# Patient Record
Sex: Male | Born: 1990
Health system: Southern US, Community
[De-identification: ages and names within clinical notes are randomized; demographics above are authoritative.]

## PROBLEM LIST (undated history)

## (undated) DIAGNOSIS — F419 Anxiety disorder, unspecified: Secondary | ICD-10-CM

## (undated) DIAGNOSIS — F32A Depression, unspecified: Secondary | ICD-10-CM

## (undated) HISTORY — DX: Depression, unspecified: F32.A

## (undated) HISTORY — DX: Anxiety disorder, unspecified: F41.9

---
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
## 2013-10-27 ENCOUNTER — Telehealth: Payer: Self-pay | Admitting: Family Medicine

## 2013-10-27 NOTE — Telephone Encounter (Signed)
Patient aware that we have no available appointments today and that he can try urgent care or i will be glad to schedule him on monday

## 2014-04-30 ENCOUNTER — Emergency Department: Payer: Self-pay | Admitting: Emergency Medicine

## 2015-10-22 ENCOUNTER — Ambulatory Visit (INDEPENDENT_AMBULATORY_CARE_PROVIDER_SITE_OTHER): Payer: Federal, State, Local not specified - PPO | Admitting: Sports Medicine

## 2015-10-22 ENCOUNTER — Encounter: Payer: Self-pay | Admitting: Sports Medicine

## 2015-10-22 DIAGNOSIS — M79672 Pain in left foot: Secondary | ICD-10-CM | POA: Diagnosis not present

## 2015-10-22 DIAGNOSIS — R61 Generalized hyperhidrosis: Secondary | ICD-10-CM

## 2015-10-22 DIAGNOSIS — L74519 Primary focal hyperhidrosis, unspecified: Secondary | ICD-10-CM | POA: Diagnosis not present

## 2015-10-22 DIAGNOSIS — Q828 Other specified congenital malformations of skin: Secondary | ICD-10-CM | POA: Diagnosis not present

## 2015-10-22 DIAGNOSIS — B070 Plantar wart: Secondary | ICD-10-CM | POA: Diagnosis not present

## 2015-10-22 NOTE — Patient Instructions (Signed)
Plantar Warts Warts are small growths on the skin. They can occur on various areas of the body. When they occur on the underside (sole) of the foot, they are called plantar warts. Plantar warts often occur in groups, with several small warts around a larger growth. They tend to develop over areas of pressure, such as the heel or the ball of the foot. Most warts are not painful, and they usually do not cause problems. However, plantar warts may cause pain when you walk because pressure is applied to them. Warts often go away on their own in time. Various treatments may be done if needed. Sometimes, warts go away and then they come back again. CAUSES Plantar warts are caused by a type of virus that is called human papillomavirus (HPV). HPV attacks a break in the skin of the foot. Walking barefoot can lead to exposure to the virus. These warts may spread to other areas of the sole. They spread to other areas of the body only through direct contact. RISK FACTORS Plantar warts are more likely to develop in:  People who are 10-20 years of age.  People who use public showers or locker rooms.  People who have a weakened body defense system (immune system). SYMPTOMS Plantar warts may be flat or slightly raised. They may grow into the deeper layers of skin or rise above the surface of the skin. Most plantar warts have a rough surface. They may cause pain when you use your foot to support your body weight. DIAGNOSIS A plantar wart can usually be diagnosed from its appearance. In some cases, a tissue sample may be removed (biopsy) to be looked at under a microscope. TREATMENT In many cases, warts do not need treatment. Without treatment, they often go away over a period of many months to a couple years. If treatment is needed, options may include:  Applying medicated solutions, creams, or patches to the wart. These may be over-the-counter or prescription medicines that make the skin soft so that layers will  gradually shed away. In many cases, the medicine is applied one or two times per day and covered with a bandage.  Putting duct tape over the top of the wart (occlusion). You will leave the tape in place for as long as told by your health care provider, then you will replace it with a new strip of tape. This is done until the wart goes away.  Freezing the wart with liquid nitrogen (cryotherapy).  Burning the wart with:  Laser treatment.  An electrified probe (electrocautery).  Injection of a medicine (Candida antigen) into the wart to help the body's immune system to fight off the wart.  Surgery to remove the wart. HOME CARE INSTRUCTIONS  Apply medicated creams or solutions only as told by your health care provider. This may involve:  Soaking the affected area in warm water.  Removing the top layer of softened skin before you apply the medicine. A pumice stone works well for removing the tissue.  Applying a bandage over the affected area after you apply the medicine.  Repeating the process daily or as told by your health care provider.  Do not scratch or pick at a wart.  Wash your hands after you touch a wart.  If a wart is painful, try applying a bandage with a hole in the middle over the wart. The helps to take pressure off the wart.  Keep all follow-up visits as told by your health care provider. This is important. PREVENTION   Take these actions to help prevent warts:  Wear shoes and socks. Change your socks daily.  Keep your feet clean and dry.  Check your feet regularly.  Avoid direct contact with warts on other people. SEEK MEDICAL CARE IF:  Your warts do not improve after treatment.  You have redness, swelling, or pain at the site of a wart.  You have bleeding from a wart that does not stop with light pressure.  You have diabetes and you develop a wart.   This information is not intended to replace advice given to you by your health care provider. Make sure  you discuss any questions you have with your health care provider.   Document Released: 05/16/2003 Document Revised: 11/14/2014 Document Reviewed: 05/21/2014 Elsevier Interactive Patient Education 2016 [employer]  

## 2015-10-22 NOTE — Progress Notes (Signed)
  Subjective: James Oliver is a 25 y.o. male patient who presents to office for evaluation of Left foot pain secondary to painful wart at the bottom of left foot. Patient has tried cleansing and trimming with no relief in symptoms. Patient denies any other pedal complaints.   There are no active problems to display for this patient.   No current outpatient prescriptions on file prior to visit.   No current facility-administered medications on file prior to visit.     No Known Allergies  Objective:  General: Alert and oriented x3 in no acute distress  Dermatology: Keratotic lesion present measuring <0.3cm at plantar sulcus and sub met 2 left with multiple small keratotic lesions also submet 1 and 5 on left with no skin lines transversing the lesion, pain is present with medial lateral pressure to the lesion, capillaries with pin point bleeding noted, no webspace macerations, no ecchymosis bilateral, all nails x 10 are well manicured.  Vascular: Dorsalis Pedis and Posterior Tibial pedal pulses 2/4, Capillary Fill Time 3 seconds, + pedal hair growth bilateral, no edema bilateral lower extremities, Temperature gradient increased with increased mositure suggestive of hyperhydrosis.  Neurology: Gross sensation intact via light touch bilateral.  Musculoskeletal: Mild tenderness with palpation at the lesion site on Left, Muscular strength 5/5 in all groups without pain or limitation on range of motion. No lower extremity muscular or boney deformity noted.  Assessment and Plan: Problem List Items Addressed This Visit    None    Visit Diagnoses    Plantar wart    -  Primary   Porokeratosis       Foot pain, left       Hyperhydrosis disorder          -Complete examination performed -Discussed treatment options for wart and keratotic lesions -Parred keratoic and warty lesions using a chisel blade; treated the area with Catharidin covered with bandaid; Advised patient of  blistering reaction that will occur from application of medication and once this happens replace bandaid with neosporin and tape/bandaid -Recommend good hygiene habits and changing socks -Will Rx Drysol once warts are resolved for hyperhydrosis  -Patient to return to office in 1 month or sooner if condition worsens.  Landis Martins, DPM

## 2015-11-19 ENCOUNTER — Ambulatory Visit: Payer: Federal, State, Local not specified - PPO | Admitting: Podiatry

## 2017-09-21 DIAGNOSIS — M4316 Spondylolisthesis, lumbar region: Secondary | ICD-10-CM | POA: Diagnosis not present

## 2017-09-21 DIAGNOSIS — M48061 Spinal stenosis, lumbar region without neurogenic claudication: Secondary | ICD-10-CM | POA: Diagnosis not present

## 2017-09-21 DIAGNOSIS — I4510 Unspecified right bundle-branch block: Secondary | ICD-10-CM | POA: Diagnosis not present

## 2017-09-21 DIAGNOSIS — Z01810 Encounter for preprocedural cardiovascular examination: Secondary | ICD-10-CM | POA: Diagnosis not present

## 2017-09-23 DIAGNOSIS — Z4689 Encounter for fitting and adjustment of other specified devices: Secondary | ICD-10-CM | POA: Diagnosis not present

## 2017-09-23 DIAGNOSIS — M4726 Other spondylosis with radiculopathy, lumbar region: Secondary | ICD-10-CM | POA: Diagnosis not present

## 2017-09-23 DIAGNOSIS — M48062 Spinal stenosis, lumbar region with neurogenic claudication: Secondary | ICD-10-CM | POA: Diagnosis not present

## 2017-09-23 DIAGNOSIS — M4316 Spondylolisthesis, lumbar region: Secondary | ICD-10-CM | POA: Diagnosis not present

## 2017-09-23 DIAGNOSIS — M545 Low back pain: Secondary | ICD-10-CM | POA: Diagnosis not present

## 2017-09-23 DIAGNOSIS — M5410 Radiculopathy, site unspecified: Secondary | ICD-10-CM | POA: Diagnosis not present

## 2017-09-27 DIAGNOSIS — J439 Emphysema, unspecified: Secondary | ICD-10-CM | POA: Diagnosis not present

## 2017-09-27 DIAGNOSIS — M48062 Spinal stenosis, lumbar region with neurogenic claudication: Secondary | ICD-10-CM | POA: Diagnosis not present

## 2017-09-27 DIAGNOSIS — G4733 Obstructive sleep apnea (adult) (pediatric): Secondary | ICD-10-CM | POA: Diagnosis not present

## 2017-09-27 DIAGNOSIS — Z87891 Personal history of nicotine dependence: Secondary | ICD-10-CM | POA: Diagnosis not present

## 2017-09-27 DIAGNOSIS — M5432 Sciatica, left side: Secondary | ICD-10-CM | POA: Diagnosis not present

## 2017-09-27 DIAGNOSIS — M4726 Other spondylosis with radiculopathy, lumbar region: Secondary | ICD-10-CM | POA: Diagnosis not present

## 2017-09-27 DIAGNOSIS — K219 Gastro-esophageal reflux disease without esophagitis: Secondary | ICD-10-CM | POA: Diagnosis not present

## 2017-09-27 DIAGNOSIS — M4316 Spondylolisthesis, lumbar region: Secondary | ICD-10-CM | POA: Diagnosis not present

## 2017-09-27 DIAGNOSIS — Z79899 Other long term (current) drug therapy: Secondary | ICD-10-CM | POA: Diagnosis not present

## 2017-09-27 DIAGNOSIS — M5431 Sciatica, right side: Secondary | ICD-10-CM | POA: Diagnosis not present

## 2017-09-28 DIAGNOSIS — M4726 Other spondylosis with radiculopathy, lumbar region: Secondary | ICD-10-CM | POA: Diagnosis not present

## 2017-09-28 DIAGNOSIS — M48062 Spinal stenosis, lumbar region with neurogenic claudication: Secondary | ICD-10-CM | POA: Diagnosis not present

## 2017-10-26 DIAGNOSIS — M545 Low back pain: Secondary | ICD-10-CM | POA: Diagnosis not present

## 2017-11-05 DIAGNOSIS — E349 Endocrine disorder, unspecified: Secondary | ICD-10-CM | POA: Diagnosis not present

## 2017-11-18 DIAGNOSIS — E349 Endocrine disorder, unspecified: Secondary | ICD-10-CM | POA: Diagnosis not present

## 2017-12-03 DIAGNOSIS — E349 Endocrine disorder, unspecified: Secondary | ICD-10-CM | POA: Diagnosis not present

## 2017-12-17 DIAGNOSIS — E349 Endocrine disorder, unspecified: Secondary | ICD-10-CM | POA: Diagnosis not present

## 2017-12-17 DIAGNOSIS — E291 Testicular hypofunction: Secondary | ICD-10-CM | POA: Diagnosis not present

## 2017-12-18 DIAGNOSIS — E291 Testicular hypofunction: Secondary | ICD-10-CM | POA: Diagnosis not present

## 2017-12-28 DIAGNOSIS — M4326 Fusion of spine, lumbar region: Secondary | ICD-10-CM | POA: Diagnosis not present

## 2017-12-31 DIAGNOSIS — E349 Endocrine disorder, unspecified: Secondary | ICD-10-CM | POA: Diagnosis not present

## 2018-01-03 DIAGNOSIS — R262 Difficulty in walking, not elsewhere classified: Secondary | ICD-10-CM | POA: Diagnosis not present

## 2018-01-03 DIAGNOSIS — M545 Low back pain: Secondary | ICD-10-CM | POA: Diagnosis not present

## 2018-01-03 DIAGNOSIS — G8929 Other chronic pain: Secondary | ICD-10-CM | POA: Diagnosis not present

## 2018-01-03 DIAGNOSIS — M6281 Muscle weakness (generalized): Secondary | ICD-10-CM | POA: Diagnosis not present

## 2018-01-06 DIAGNOSIS — R262 Difficulty in walking, not elsewhere classified: Secondary | ICD-10-CM | POA: Diagnosis not present

## 2018-01-06 DIAGNOSIS — G8929 Other chronic pain: Secondary | ICD-10-CM | POA: Diagnosis not present

## 2018-01-06 DIAGNOSIS — M6281 Muscle weakness (generalized): Secondary | ICD-10-CM | POA: Diagnosis not present

## 2018-01-06 DIAGNOSIS — M545 Low back pain: Secondary | ICD-10-CM | POA: Diagnosis not present

## 2018-01-10 DIAGNOSIS — M6281 Muscle weakness (generalized): Secondary | ICD-10-CM | POA: Diagnosis not present

## 2018-01-10 DIAGNOSIS — R262 Difficulty in walking, not elsewhere classified: Secondary | ICD-10-CM | POA: Diagnosis not present

## 2018-01-10 DIAGNOSIS — M5442 Lumbago with sciatica, left side: Secondary | ICD-10-CM | POA: Diagnosis not present

## 2018-01-10 DIAGNOSIS — M545 Low back pain: Secondary | ICD-10-CM | POA: Diagnosis not present

## 2018-01-10 DIAGNOSIS — G8929 Other chronic pain: Secondary | ICD-10-CM | POA: Diagnosis not present

## 2018-01-13 DIAGNOSIS — M6281 Muscle weakness (generalized): Secondary | ICD-10-CM | POA: Diagnosis not present

## 2018-01-13 DIAGNOSIS — G8929 Other chronic pain: Secondary | ICD-10-CM | POA: Diagnosis not present

## 2018-01-13 DIAGNOSIS — M5442 Lumbago with sciatica, left side: Secondary | ICD-10-CM | POA: Diagnosis not present

## 2018-01-13 DIAGNOSIS — M545 Low back pain: Secondary | ICD-10-CM | POA: Diagnosis not present

## 2018-01-13 DIAGNOSIS — R262 Difficulty in walking, not elsewhere classified: Secondary | ICD-10-CM | POA: Diagnosis not present

## 2018-01-14 DIAGNOSIS — K219 Gastro-esophageal reflux disease without esophagitis: Secondary | ICD-10-CM | POA: Diagnosis not present

## 2018-01-14 DIAGNOSIS — E782 Mixed hyperlipidemia: Secondary | ICD-10-CM | POA: Diagnosis not present

## 2018-01-14 DIAGNOSIS — E291 Testicular hypofunction: Secondary | ICD-10-CM | POA: Diagnosis not present

## 2018-01-14 DIAGNOSIS — R7301 Impaired fasting glucose: Secondary | ICD-10-CM | POA: Diagnosis not present

## 2018-01-14 DIAGNOSIS — E349 Endocrine disorder, unspecified: Secondary | ICD-10-CM | POA: Diagnosis not present

## 2018-01-14 DIAGNOSIS — M48061 Spinal stenosis, lumbar region without neurogenic claudication: Secondary | ICD-10-CM | POA: Diagnosis not present

## 2018-01-17 DIAGNOSIS — M6281 Muscle weakness (generalized): Secondary | ICD-10-CM | POA: Diagnosis not present

## 2018-01-17 DIAGNOSIS — M545 Low back pain: Secondary | ICD-10-CM | POA: Diagnosis not present

## 2018-01-17 DIAGNOSIS — G8929 Other chronic pain: Secondary | ICD-10-CM | POA: Diagnosis not present

## 2018-01-17 DIAGNOSIS — M5442 Lumbago with sciatica, left side: Secondary | ICD-10-CM | POA: Diagnosis not present

## 2018-01-17 DIAGNOSIS — R262 Difficulty in walking, not elsewhere classified: Secondary | ICD-10-CM | POA: Diagnosis not present

## 2018-01-20 DIAGNOSIS — M6281 Muscle weakness (generalized): Secondary | ICD-10-CM | POA: Diagnosis not present

## 2018-01-20 DIAGNOSIS — G8929 Other chronic pain: Secondary | ICD-10-CM | POA: Diagnosis not present

## 2018-01-20 DIAGNOSIS — M5442 Lumbago with sciatica, left side: Secondary | ICD-10-CM | POA: Diagnosis not present

## 2018-01-20 DIAGNOSIS — R262 Difficulty in walking, not elsewhere classified: Secondary | ICD-10-CM | POA: Diagnosis not present

## 2018-01-20 DIAGNOSIS — M545 Low back pain: Secondary | ICD-10-CM | POA: Diagnosis not present

## 2018-01-24 DIAGNOSIS — M5442 Lumbago with sciatica, left side: Secondary | ICD-10-CM | POA: Diagnosis not present

## 2018-01-24 DIAGNOSIS — M545 Low back pain: Secondary | ICD-10-CM | POA: Diagnosis not present

## 2018-01-24 DIAGNOSIS — M6281 Muscle weakness (generalized): Secondary | ICD-10-CM | POA: Diagnosis not present

## 2018-01-24 DIAGNOSIS — G8929 Other chronic pain: Secondary | ICD-10-CM | POA: Diagnosis not present

## 2018-01-24 DIAGNOSIS — R262 Difficulty in walking, not elsewhere classified: Secondary | ICD-10-CM | POA: Diagnosis not present

## 2018-01-27 DIAGNOSIS — G8929 Other chronic pain: Secondary | ICD-10-CM | POA: Diagnosis not present

## 2018-01-27 DIAGNOSIS — M6281 Muscle weakness (generalized): Secondary | ICD-10-CM | POA: Diagnosis not present

## 2018-01-27 DIAGNOSIS — R262 Difficulty in walking, not elsewhere classified: Secondary | ICD-10-CM | POA: Diagnosis not present

## 2018-01-27 DIAGNOSIS — M5442 Lumbago with sciatica, left side: Secondary | ICD-10-CM | POA: Diagnosis not present

## 2018-01-27 DIAGNOSIS — M545 Low back pain: Secondary | ICD-10-CM | POA: Diagnosis not present

## 2018-01-28 DIAGNOSIS — E349 Endocrine disorder, unspecified: Secondary | ICD-10-CM | POA: Diagnosis not present

## 2018-01-31 DIAGNOSIS — M6281 Muscle weakness (generalized): Secondary | ICD-10-CM | POA: Diagnosis not present

## 2018-01-31 DIAGNOSIS — M5442 Lumbago with sciatica, left side: Secondary | ICD-10-CM | POA: Diagnosis not present

## 2018-01-31 DIAGNOSIS — M545 Low back pain: Secondary | ICD-10-CM | POA: Diagnosis not present

## 2018-01-31 DIAGNOSIS — G8929 Other chronic pain: Secondary | ICD-10-CM | POA: Diagnosis not present

## 2018-01-31 DIAGNOSIS — R262 Difficulty in walking, not elsewhere classified: Secondary | ICD-10-CM | POA: Diagnosis not present

## 2018-02-01 DIAGNOSIS — F1120 Opioid dependence, uncomplicated: Secondary | ICD-10-CM | POA: Diagnosis not present

## 2018-02-01 DIAGNOSIS — E119 Type 2 diabetes mellitus without complications: Secondary | ICD-10-CM | POA: Diagnosis not present

## 2018-02-02 DIAGNOSIS — M5442 Lumbago with sciatica, left side: Secondary | ICD-10-CM | POA: Diagnosis not present

## 2018-02-02 DIAGNOSIS — M545 Low back pain: Secondary | ICD-10-CM | POA: Diagnosis not present

## 2018-02-02 DIAGNOSIS — G8929 Other chronic pain: Secondary | ICD-10-CM | POA: Diagnosis not present

## 2018-02-02 DIAGNOSIS — M6281 Muscle weakness (generalized): Secondary | ICD-10-CM | POA: Diagnosis not present

## 2018-02-02 DIAGNOSIS — R262 Difficulty in walking, not elsewhere classified: Secondary | ICD-10-CM | POA: Diagnosis not present

## 2018-02-14 DIAGNOSIS — E349 Endocrine disorder, unspecified: Secondary | ICD-10-CM | POA: Diagnosis not present

## 2018-02-28 DIAGNOSIS — E349 Endocrine disorder, unspecified: Secondary | ICD-10-CM | POA: Diagnosis not present

## 2018-08-16 DIAGNOSIS — E349 Endocrine disorder, unspecified: Secondary | ICD-10-CM | POA: Diagnosis not present

## 2018-08-30 DIAGNOSIS — E349 Endocrine disorder, unspecified: Secondary | ICD-10-CM | POA: Diagnosis not present

## 2018-09-13 DIAGNOSIS — E349 Endocrine disorder, unspecified: Secondary | ICD-10-CM | POA: Diagnosis not present

## 2018-09-27 DIAGNOSIS — M4326 Fusion of spine, lumbar region: Secondary | ICD-10-CM | POA: Diagnosis not present

## 2018-09-27 DIAGNOSIS — E349 Endocrine disorder, unspecified: Secondary | ICD-10-CM | POA: Diagnosis not present

## 2018-10-11 DIAGNOSIS — E349 Endocrine disorder, unspecified: Secondary | ICD-10-CM | POA: Diagnosis not present

## 2018-10-25 DIAGNOSIS — E349 Endocrine disorder, unspecified: Secondary | ICD-10-CM | POA: Diagnosis not present

## 2018-11-08 DIAGNOSIS — Z23 Encounter for immunization: Secondary | ICD-10-CM | POA: Diagnosis not present

## 2018-11-08 DIAGNOSIS — E349 Endocrine disorder, unspecified: Secondary | ICD-10-CM | POA: Diagnosis not present

## 2018-11-22 DIAGNOSIS — E349 Endocrine disorder, unspecified: Secondary | ICD-10-CM | POA: Diagnosis not present

## 2018-12-01 DIAGNOSIS — R0789 Other chest pain: Secondary | ICD-10-CM | POA: Diagnosis not present

## 2018-12-01 DIAGNOSIS — E039 Hypothyroidism, unspecified: Secondary | ICD-10-CM | POA: Diagnosis not present

## 2018-12-01 DIAGNOSIS — M79631 Pain in right forearm: Secondary | ICD-10-CM | POA: Diagnosis not present

## 2018-12-01 DIAGNOSIS — Z79899 Other long term (current) drug therapy: Secondary | ICD-10-CM | POA: Diagnosis not present

## 2018-12-01 DIAGNOSIS — M542 Cervicalgia: Secondary | ICD-10-CM | POA: Diagnosis not present

## 2018-12-01 DIAGNOSIS — M545 Low back pain: Secondary | ICD-10-CM | POA: Diagnosis not present

## 2018-12-01 DIAGNOSIS — S59911A Unspecified injury of right forearm, initial encounter: Secondary | ICD-10-CM | POA: Diagnosis not present

## 2018-12-01 DIAGNOSIS — R0781 Pleurodynia: Secondary | ICD-10-CM | POA: Diagnosis not present

## 2018-12-01 DIAGNOSIS — Y999 Unspecified external cause status: Secondary | ICD-10-CM | POA: Diagnosis not present

## 2018-12-01 DIAGNOSIS — Z87891 Personal history of nicotine dependence: Secondary | ICD-10-CM | POA: Diagnosis not present

## 2018-12-01 DIAGNOSIS — Y92410 Unspecified street and highway as the place of occurrence of the external cause: Secondary | ICD-10-CM | POA: Diagnosis not present

## 2018-12-06 DIAGNOSIS — E349 Endocrine disorder, unspecified: Secondary | ICD-10-CM | POA: Diagnosis not present

## 2018-12-06 DIAGNOSIS — S40021A Contusion of right upper arm, initial encounter: Secondary | ICD-10-CM | POA: Diagnosis not present

## 2018-12-06 DIAGNOSIS — S20212A Contusion of left front wall of thorax, initial encounter: Secondary | ICD-10-CM | POA: Diagnosis not present

## 2018-12-06 DIAGNOSIS — S40012A Contusion of left shoulder, initial encounter: Secondary | ICD-10-CM | POA: Diagnosis not present

## 2018-12-20 DIAGNOSIS — E349 Endocrine disorder, unspecified: Secondary | ICD-10-CM | POA: Diagnosis not present

## 2018-12-20 DIAGNOSIS — S90112A Contusion of left great toe without damage to nail, initial encounter: Secondary | ICD-10-CM | POA: Diagnosis not present

## 2018-12-20 DIAGNOSIS — S20212D Contusion of left front wall of thorax, subsequent encounter: Secondary | ICD-10-CM | POA: Diagnosis not present

## 2018-12-20 DIAGNOSIS — S40012D Contusion of left shoulder, subsequent encounter: Secondary | ICD-10-CM | POA: Diagnosis not present

## 2019-01-03 DIAGNOSIS — E349 Endocrine disorder, unspecified: Secondary | ICD-10-CM | POA: Diagnosis not present

## 2019-01-16 DIAGNOSIS — Z0001 Encounter for general adult medical examination with abnormal findings: Secondary | ICD-10-CM | POA: Diagnosis not present

## 2019-01-16 DIAGNOSIS — Z0000 Encounter for general adult medical examination without abnormal findings: Secondary | ICD-10-CM | POA: Diagnosis not present

## 2019-01-16 DIAGNOSIS — Z125 Encounter for screening for malignant neoplasm of prostate: Secondary | ICD-10-CM | POA: Diagnosis not present

## 2019-01-16 DIAGNOSIS — J301 Allergic rhinitis due to pollen: Secondary | ICD-10-CM | POA: Diagnosis not present

## 2019-01-16 DIAGNOSIS — Z20828 Contact with and (suspected) exposure to other viral communicable diseases: Secondary | ICD-10-CM | POA: Diagnosis not present

## 2019-01-16 DIAGNOSIS — E559 Vitamin D deficiency, unspecified: Secondary | ICD-10-CM | POA: Diagnosis not present

## 2019-01-16 DIAGNOSIS — E291 Testicular hypofunction: Secondary | ICD-10-CM | POA: Diagnosis not present

## 2019-01-16 DIAGNOSIS — E782 Mixed hyperlipidemia: Secondary | ICD-10-CM | POA: Diagnosis not present

## 2019-01-30 DIAGNOSIS — E349 Endocrine disorder, unspecified: Secondary | ICD-10-CM | POA: Diagnosis not present

## 2019-01-30 DIAGNOSIS — E291 Testicular hypofunction: Secondary | ICD-10-CM | POA: Diagnosis not present

## 2019-02-13 DIAGNOSIS — E291 Testicular hypofunction: Secondary | ICD-10-CM | POA: Diagnosis not present

## 2019-02-13 DIAGNOSIS — E349 Endocrine disorder, unspecified: Secondary | ICD-10-CM | POA: Diagnosis not present

## 2019-02-27 DIAGNOSIS — E349 Endocrine disorder, unspecified: Secondary | ICD-10-CM | POA: Diagnosis not present

## 2019-03-13 DIAGNOSIS — E349 Endocrine disorder, unspecified: Secondary | ICD-10-CM | POA: Diagnosis not present

## 2019-03-17 DIAGNOSIS — M9901 Segmental and somatic dysfunction of cervical region: Secondary | ICD-10-CM | POA: Diagnosis not present

## 2019-03-17 DIAGNOSIS — G540 Brachial plexus disorders: Secondary | ICD-10-CM | POA: Diagnosis not present

## 2019-03-17 DIAGNOSIS — M542 Cervicalgia: Secondary | ICD-10-CM | POA: Diagnosis not present

## 2019-03-17 DIAGNOSIS — M436 Torticollis: Secondary | ICD-10-CM | POA: Diagnosis not present

## 2019-03-20 DIAGNOSIS — M542 Cervicalgia: Secondary | ICD-10-CM | POA: Diagnosis not present

## 2019-03-20 DIAGNOSIS — G540 Brachial plexus disorders: Secondary | ICD-10-CM | POA: Diagnosis not present

## 2019-03-20 DIAGNOSIS — M436 Torticollis: Secondary | ICD-10-CM | POA: Diagnosis not present

## 2019-03-20 DIAGNOSIS — M9901 Segmental and somatic dysfunction of cervical region: Secondary | ICD-10-CM | POA: Diagnosis not present

## 2019-03-27 DIAGNOSIS — E349 Endocrine disorder, unspecified: Secondary | ICD-10-CM | POA: Diagnosis not present

## 2019-04-07 DIAGNOSIS — M4726 Other spondylosis with radiculopathy, lumbar region: Secondary | ICD-10-CM | POA: Diagnosis not present

## 2019-04-07 DIAGNOSIS — M961 Postlaminectomy syndrome, not elsewhere classified: Secondary | ICD-10-CM | POA: Diagnosis not present

## 2019-04-07 DIAGNOSIS — M4326 Fusion of spine, lumbar region: Secondary | ICD-10-CM | POA: Diagnosis not present

## 2019-04-07 DIAGNOSIS — M5431 Sciatica, right side: Secondary | ICD-10-CM | POA: Diagnosis not present

## 2019-04-10 DIAGNOSIS — E349 Endocrine disorder, unspecified: Secondary | ICD-10-CM | POA: Diagnosis not present

## 2019-04-18 DIAGNOSIS — E291 Testicular hypofunction: Secondary | ICD-10-CM | POA: Diagnosis not present

## 2019-04-18 DIAGNOSIS — M4316 Spondylolisthesis, lumbar region: Secondary | ICD-10-CM | POA: Diagnosis not present

## 2019-04-18 DIAGNOSIS — K219 Gastro-esophageal reflux disease without esophagitis: Secondary | ICD-10-CM | POA: Diagnosis not present

## 2019-04-18 DIAGNOSIS — M48061 Spinal stenosis, lumbar region without neurogenic claudication: Secondary | ICD-10-CM | POA: Diagnosis not present

## 2019-04-20 DIAGNOSIS — K219 Gastro-esophageal reflux disease without esophagitis: Secondary | ICD-10-CM | POA: Diagnosis not present

## 2019-04-20 DIAGNOSIS — E119 Type 2 diabetes mellitus without complications: Secondary | ICD-10-CM | POA: Diagnosis not present

## 2019-04-20 DIAGNOSIS — E291 Testicular hypofunction: Secondary | ICD-10-CM | POA: Diagnosis not present

## 2019-04-20 DIAGNOSIS — E782 Mixed hyperlipidemia: Secondary | ICD-10-CM | POA: Diagnosis not present

## 2019-04-20 DIAGNOSIS — M48061 Spinal stenosis, lumbar region without neurogenic claudication: Secondary | ICD-10-CM | POA: Diagnosis not present

## 2019-04-20 DIAGNOSIS — M4316 Spondylolisthesis, lumbar region: Secondary | ICD-10-CM | POA: Diagnosis not present

## 2019-04-24 DIAGNOSIS — E349 Endocrine disorder, unspecified: Secondary | ICD-10-CM | POA: Diagnosis not present

## 2019-04-25 DIAGNOSIS — M48061 Spinal stenosis, lumbar region without neurogenic claudication: Secondary | ICD-10-CM | POA: Diagnosis not present

## 2019-05-02 DIAGNOSIS — M48062 Spinal stenosis, lumbar region with neurogenic claudication: Secondary | ICD-10-CM | POA: Diagnosis not present

## 2019-05-02 DIAGNOSIS — M5431 Sciatica, right side: Secondary | ICD-10-CM | POA: Diagnosis not present

## 2019-05-02 DIAGNOSIS — M961 Postlaminectomy syndrome, not elsewhere classified: Secondary | ICD-10-CM | POA: Diagnosis not present

## 2019-05-02 DIAGNOSIS — M4726 Other spondylosis with radiculopathy, lumbar region: Secondary | ICD-10-CM | POA: Diagnosis not present

## 2019-05-08 DIAGNOSIS — E291 Testicular hypofunction: Secondary | ICD-10-CM | POA: Diagnosis not present

## 2019-05-08 DIAGNOSIS — E349 Endocrine disorder, unspecified: Secondary | ICD-10-CM | POA: Diagnosis not present

## 2019-05-22 DIAGNOSIS — E349 Endocrine disorder, unspecified: Secondary | ICD-10-CM | POA: Diagnosis not present

## 2019-06-05 DIAGNOSIS — E349 Endocrine disorder, unspecified: Secondary | ICD-10-CM | POA: Diagnosis not present

## 2019-06-05 DIAGNOSIS — E291 Testicular hypofunction: Secondary | ICD-10-CM | POA: Diagnosis not present

## 2019-06-08 DIAGNOSIS — Z23 Encounter for immunization: Secondary | ICD-10-CM | POA: Diagnosis not present

## 2019-06-19 DIAGNOSIS — E349 Endocrine disorder, unspecified: Secondary | ICD-10-CM | POA: Diagnosis not present

## 2019-06-19 DIAGNOSIS — E291 Testicular hypofunction: Secondary | ICD-10-CM | POA: Diagnosis not present

## 2019-06-29 DIAGNOSIS — M961 Postlaminectomy syndrome, not elsewhere classified: Secondary | ICD-10-CM | POA: Diagnosis not present

## 2019-06-29 DIAGNOSIS — M4726 Other spondylosis with radiculopathy, lumbar region: Secondary | ICD-10-CM | POA: Diagnosis not present

## 2019-06-29 DIAGNOSIS — M48062 Spinal stenosis, lumbar region with neurogenic claudication: Secondary | ICD-10-CM | POA: Diagnosis not present

## 2019-06-29 DIAGNOSIS — M5431 Sciatica, right side: Secondary | ICD-10-CM | POA: Diagnosis not present

## 2019-06-30 DIAGNOSIS — Z23 Encounter for immunization: Secondary | ICD-10-CM | POA: Diagnosis not present

## 2019-07-03 DIAGNOSIS — E291 Testicular hypofunction: Secondary | ICD-10-CM | POA: Diagnosis not present

## 2019-07-14 DIAGNOSIS — I4510 Unspecified right bundle-branch block: Secondary | ICD-10-CM | POA: Diagnosis not present

## 2019-07-14 DIAGNOSIS — E291 Testicular hypofunction: Secondary | ICD-10-CM | POA: Diagnosis not present

## 2019-07-14 DIAGNOSIS — Z01812 Encounter for preprocedural laboratory examination: Secondary | ICD-10-CM | POA: Diagnosis not present

## 2019-07-14 DIAGNOSIS — M961 Postlaminectomy syndrome, not elsewhere classified: Secondary | ICD-10-CM | POA: Diagnosis not present

## 2019-07-14 DIAGNOSIS — M5431 Sciatica, right side: Secondary | ICD-10-CM | POA: Diagnosis not present

## 2019-07-14 DIAGNOSIS — M4726 Other spondylosis with radiculopathy, lumbar region: Secondary | ICD-10-CM | POA: Diagnosis not present

## 2019-07-14 DIAGNOSIS — M48062 Spinal stenosis, lumbar region with neurogenic claudication: Secondary | ICD-10-CM | POA: Diagnosis not present

## 2019-07-14 DIAGNOSIS — Z01810 Encounter for preprocedural cardiovascular examination: Secondary | ICD-10-CM | POA: Diagnosis not present

## 2019-07-14 DIAGNOSIS — M4316 Spondylolisthesis, lumbar region: Secondary | ICD-10-CM | POA: Diagnosis not present

## 2019-07-17 DIAGNOSIS — E349 Endocrine disorder, unspecified: Secondary | ICD-10-CM | POA: Diagnosis not present

## 2019-07-19 DIAGNOSIS — M4726 Other spondylosis with radiculopathy, lumbar region: Secondary | ICD-10-CM | POA: Diagnosis not present

## 2019-07-19 DIAGNOSIS — M5431 Sciatica, right side: Secondary | ICD-10-CM | POA: Diagnosis not present

## 2019-07-19 DIAGNOSIS — M963 Postlaminectomy kyphosis: Secondary | ICD-10-CM | POA: Diagnosis not present

## 2019-07-19 DIAGNOSIS — G4733 Obstructive sleep apnea (adult) (pediatric): Secondary | ICD-10-CM | POA: Diagnosis not present

## 2019-07-19 DIAGNOSIS — M5432 Sciatica, left side: Secondary | ICD-10-CM | POA: Diagnosis not present

## 2019-07-19 DIAGNOSIS — M4326 Fusion of spine, lumbar region: Secondary | ICD-10-CM | POA: Diagnosis not present

## 2019-07-19 DIAGNOSIS — Z981 Arthrodesis status: Secondary | ICD-10-CM | POA: Diagnosis not present

## 2019-07-19 DIAGNOSIS — M48062 Spinal stenosis, lumbar region with neurogenic claudication: Secondary | ICD-10-CM | POA: Diagnosis not present

## 2019-07-19 DIAGNOSIS — M961 Postlaminectomy syndrome, not elsewhere classified: Secondary | ICD-10-CM | POA: Diagnosis not present

## 2019-07-19 DIAGNOSIS — Z79899 Other long term (current) drug therapy: Secondary | ICD-10-CM | POA: Diagnosis not present

## 2019-07-20 DIAGNOSIS — M5431 Sciatica, right side: Secondary | ICD-10-CM | POA: Diagnosis not present

## 2019-07-20 DIAGNOSIS — M961 Postlaminectomy syndrome, not elsewhere classified: Secondary | ICD-10-CM | POA: Diagnosis not present

## 2019-07-20 DIAGNOSIS — Z79899 Other long term (current) drug therapy: Secondary | ICD-10-CM | POA: Diagnosis not present

## 2019-07-20 DIAGNOSIS — M48062 Spinal stenosis, lumbar region with neurogenic claudication: Secondary | ICD-10-CM | POA: Diagnosis not present

## 2019-07-20 DIAGNOSIS — M545 Low back pain: Secondary | ICD-10-CM | POA: Diagnosis not present

## 2019-07-20 DIAGNOSIS — M5432 Sciatica, left side: Secondary | ICD-10-CM | POA: Diagnosis not present

## 2019-07-20 DIAGNOSIS — M4726 Other spondylosis with radiculopathy, lumbar region: Secondary | ICD-10-CM | POA: Diagnosis not present

## 2019-07-20 DIAGNOSIS — Z981 Arthrodesis status: Secondary | ICD-10-CM | POA: Diagnosis not present

## 2019-07-20 DIAGNOSIS — G4733 Obstructive sleep apnea (adult) (pediatric): Secondary | ICD-10-CM | POA: Diagnosis not present

## 2019-07-24 DIAGNOSIS — E559 Vitamin D deficiency, unspecified: Secondary | ICD-10-CM | POA: Diagnosis not present

## 2019-07-24 DIAGNOSIS — E291 Testicular hypofunction: Secondary | ICD-10-CM | POA: Diagnosis not present

## 2019-07-24 DIAGNOSIS — E349 Endocrine disorder, unspecified: Secondary | ICD-10-CM | POA: Diagnosis not present

## 2019-07-24 DIAGNOSIS — E119 Type 2 diabetes mellitus without complications: Secondary | ICD-10-CM | POA: Diagnosis not present

## 2019-07-24 DIAGNOSIS — K219 Gastro-esophageal reflux disease without esophagitis: Secondary | ICD-10-CM | POA: Diagnosis not present

## 2019-07-24 DIAGNOSIS — E782 Mixed hyperlipidemia: Secondary | ICD-10-CM | POA: Diagnosis not present

## 2019-07-31 DIAGNOSIS — E349 Endocrine disorder, unspecified: Secondary | ICD-10-CM | POA: Diagnosis not present

## 2019-08-15 DIAGNOSIS — E349 Endocrine disorder, unspecified: Secondary | ICD-10-CM | POA: Diagnosis not present

## 2019-08-17 DIAGNOSIS — Z981 Arthrodesis status: Secondary | ICD-10-CM | POA: Diagnosis not present

## 2019-08-17 DIAGNOSIS — M4326 Fusion of spine, lumbar region: Secondary | ICD-10-CM | POA: Diagnosis not present

## 2019-08-17 DIAGNOSIS — M961 Postlaminectomy syndrome, not elsewhere classified: Secondary | ICD-10-CM | POA: Diagnosis not present

## 2019-08-17 DIAGNOSIS — M4316 Spondylolisthesis, lumbar region: Secondary | ICD-10-CM | POA: Diagnosis not present

## 2019-08-29 DIAGNOSIS — E349 Endocrine disorder, unspecified: Secondary | ICD-10-CM | POA: Diagnosis not present

## 2019-09-12 DIAGNOSIS — E349 Endocrine disorder, unspecified: Secondary | ICD-10-CM | POA: Diagnosis not present

## 2019-09-26 DIAGNOSIS — E349 Endocrine disorder, unspecified: Secondary | ICD-10-CM | POA: Diagnosis not present

## 2019-10-10 DIAGNOSIS — E349 Endocrine disorder, unspecified: Secondary | ICD-10-CM | POA: Diagnosis not present

## 2019-10-17 DIAGNOSIS — M961 Postlaminectomy syndrome, not elsewhere classified: Secondary | ICD-10-CM | POA: Diagnosis not present

## 2019-10-17 DIAGNOSIS — M48062 Spinal stenosis, lumbar region with neurogenic claudication: Secondary | ICD-10-CM | POA: Diagnosis not present

## 2019-10-17 DIAGNOSIS — M4326 Fusion of spine, lumbar region: Secondary | ICD-10-CM | POA: Diagnosis not present

## 2019-10-17 DIAGNOSIS — M4726 Other spondylosis with radiculopathy, lumbar region: Secondary | ICD-10-CM | POA: Diagnosis not present

## 2019-10-25 DIAGNOSIS — E349 Endocrine disorder, unspecified: Secondary | ICD-10-CM | POA: Diagnosis not present

## 2019-10-25 DIAGNOSIS — E782 Mixed hyperlipidemia: Secondary | ICD-10-CM | POA: Diagnosis not present

## 2019-10-25 DIAGNOSIS — K219 Gastro-esophageal reflux disease without esophagitis: Secondary | ICD-10-CM | POA: Diagnosis not present

## 2019-10-25 DIAGNOSIS — E291 Testicular hypofunction: Secondary | ICD-10-CM | POA: Diagnosis not present

## 2019-10-25 DIAGNOSIS — M48061 Spinal stenosis, lumbar region without neurogenic claudication: Secondary | ICD-10-CM | POA: Diagnosis not present

## 2019-10-26 DIAGNOSIS — E119 Type 2 diabetes mellitus without complications: Secondary | ICD-10-CM | POA: Diagnosis not present

## 2019-10-26 DIAGNOSIS — E291 Testicular hypofunction: Secondary | ICD-10-CM | POA: Diagnosis not present

## 2019-10-26 DIAGNOSIS — E782 Mixed hyperlipidemia: Secondary | ICD-10-CM | POA: Diagnosis not present

## 2019-11-08 DIAGNOSIS — E349 Endocrine disorder, unspecified: Secondary | ICD-10-CM | POA: Diagnosis not present

## 2019-11-22 DIAGNOSIS — E349 Endocrine disorder, unspecified: Secondary | ICD-10-CM | POA: Diagnosis not present

## 2019-12-06 DIAGNOSIS — Z23 Encounter for immunization: Secondary | ICD-10-CM | POA: Diagnosis not present

## 2019-12-06 DIAGNOSIS — E349 Endocrine disorder, unspecified: Secondary | ICD-10-CM | POA: Diagnosis not present

## 2019-12-20 DIAGNOSIS — E349 Endocrine disorder, unspecified: Secondary | ICD-10-CM | POA: Diagnosis not present

## 2020-01-03 DIAGNOSIS — E349 Endocrine disorder, unspecified: Secondary | ICD-10-CM | POA: Diagnosis not present

## 2020-01-17 DIAGNOSIS — E349 Endocrine disorder, unspecified: Secondary | ICD-10-CM | POA: Diagnosis not present

## 2020-01-18 DIAGNOSIS — M4326 Fusion of spine, lumbar region: Secondary | ICD-10-CM | POA: Diagnosis not present

## 2020-01-18 DIAGNOSIS — M4726 Other spondylosis with radiculopathy, lumbar region: Secondary | ICD-10-CM | POA: Diagnosis not present

## 2020-01-18 DIAGNOSIS — M48062 Spinal stenosis, lumbar region with neurogenic claudication: Secondary | ICD-10-CM | POA: Diagnosis not present

## 2020-01-31 DIAGNOSIS — E349 Endocrine disorder, unspecified: Secondary | ICD-10-CM | POA: Diagnosis not present

## 2020-02-14 DIAGNOSIS — E349 Endocrine disorder, unspecified: Secondary | ICD-10-CM | POA: Diagnosis not present

## 2020-02-28 DIAGNOSIS — E349 Endocrine disorder, unspecified: Secondary | ICD-10-CM | POA: Diagnosis not present

## 2020-03-13 DIAGNOSIS — E349 Endocrine disorder, unspecified: Secondary | ICD-10-CM | POA: Diagnosis not present

## 2020-03-27 DIAGNOSIS — E349 Endocrine disorder, unspecified: Secondary | ICD-10-CM | POA: Diagnosis not present

## 2020-04-10 DIAGNOSIS — E349 Endocrine disorder, unspecified: Secondary | ICD-10-CM | POA: Diagnosis not present

## 2020-04-24 DIAGNOSIS — E349 Endocrine disorder, unspecified: Secondary | ICD-10-CM | POA: Diagnosis not present

## 2020-04-25 DIAGNOSIS — E782 Mixed hyperlipidemia: Secondary | ICD-10-CM | POA: Diagnosis not present

## 2020-04-25 DIAGNOSIS — M4316 Spondylolisthesis, lumbar region: Secondary | ICD-10-CM | POA: Diagnosis not present

## 2020-04-25 DIAGNOSIS — E291 Testicular hypofunction: Secondary | ICD-10-CM | POA: Diagnosis not present

## 2020-04-25 DIAGNOSIS — M48061 Spinal stenosis, lumbar region without neurogenic claudication: Secondary | ICD-10-CM | POA: Diagnosis not present

## 2020-05-08 DIAGNOSIS — E349 Endocrine disorder, unspecified: Secondary | ICD-10-CM | POA: Diagnosis not present

## 2020-05-22 DIAGNOSIS — E349 Endocrine disorder, unspecified: Secondary | ICD-10-CM | POA: Diagnosis not present

## 2020-05-23 DIAGNOSIS — E291 Testicular hypofunction: Secondary | ICD-10-CM | POA: Diagnosis not present

## 2020-06-05 DIAGNOSIS — E349 Endocrine disorder, unspecified: Secondary | ICD-10-CM | POA: Diagnosis not present

## 2020-06-19 DIAGNOSIS — E349 Endocrine disorder, unspecified: Secondary | ICD-10-CM | POA: Diagnosis not present

## 2020-07-03 DIAGNOSIS — E291 Testicular hypofunction: Secondary | ICD-10-CM | POA: Diagnosis not present

## 2023-09-18 ENCOUNTER — Other Ambulatory Visit: Payer: Self-pay

## 2023-09-18 ENCOUNTER — Encounter: Payer: Self-pay | Admitting: Emergency Medicine

## 2023-09-18 ENCOUNTER — Ambulatory Visit
Admission: EM | Admit: 2023-09-18 | Discharge: 2023-09-18 | Disposition: A | Discharge status: Home / Self Care | Attending: Family Medicine | Admitting: Family Medicine

## 2023-09-18 DIAGNOSIS — R5381 Other malaise: Secondary | ICD-10-CM | POA: Diagnosis not present

## 2023-09-18 DIAGNOSIS — R52 Pain, unspecified: Secondary | ICD-10-CM

## 2023-09-18 DIAGNOSIS — R21 Rash and other nonspecific skin eruption: Secondary | ICD-10-CM

## 2023-09-18 MED ORDER — TRIAMCINOLONE ACETONIDE 0.1 % EX CREA: 1.0000 | TOPICAL_CREAM | Freq: Two times a day (BID) | CUTANEOUS | 0 refills | Status: AC | PRN

## 2023-09-18 NOTE — Discharge Instructions (Addendum)
 We have drawn labs for lyme disease and rocky mountain spotted fever today and will be in touch if anything comes back positive. Use the triamcinolone  twice daily as needed for the rash and stay well hydrated, take tylenol and ibuprofen as needed for body aches. Follow up for progressively worsening symptoms

## 2023-09-18 NOTE — ED Triage Notes (Addendum)
 Pt reports was vacationing at [facility] the week of 4th July and reports possible insect bite to right lower leg. Pt noted to have drawn circle around redness. Pt reports generalized body aches, general malaise, and inquiring about possible lyme disease, tick related illnesses.   Pt tearful with moderate anxiety noted in triage. Pt reports this is at baseline and reports is currently in process of finding pcp. Discussed [place] resources.

## 2023-09-21 NOTE — ED Provider Notes (Signed)
 [facility]    CSN: [id number] Arrival date & time: 09/18/23  1106      History   Chief Complaint Chief Complaint  Patient presents with   Insect Bite    HPI James Oliver is a 33 y.o. male.   Presenting today with rash to right lower leg near right knee that he first noticed yesterday. [place] bite to the area and notes it is now becoming more swollen and red and last night looked a bit like a bulls eye. Denies fever, chills, body aches, headache, myalgias, arthralgias. So far not trying anything OTC for sxs.    History reviewed. No pertinent past medical history.  There are no active problems to display for this patient.   History reviewed. No pertinent surgical history.     Home Medications    Prior to Admission medications   Medication Sig Start Date End Date Taking? Authorizing Provider  triamcinolone  cream (KENALOG ) 0.1 % Apply 1 Application topically 2 (two) times daily as needed. 09/18/23  Yes Stuart Vernell Norris, PA-C    Family History History reviewed. No pertinent family history.  Social History Social History   Tobacco Use   Smoking status: Unknown  Vaping Use   Vaping status: Every Day  Substance Use Topics   Alcohol use: Yes    Comment: socially     Allergies   Patient has no known allergies.   Review of Systems Review of Systems PER HPI  Physical Exam Triage Vital Signs ED Triage Vitals  Encounter Vitals Group     BP 09/18/23 1127 120/79     Girls Systolic BP Percentile --      Girls Diastolic BP Percentile --      Boys Systolic BP Percentile --      Boys Diastolic BP Percentile --      Pulse Rate 09/18/23 1127 (!) 111     Resp 09/18/23 1127 20     Temp 09/18/23 1127 98.8 F (37.1 C)     Temp Source 09/18/23 1127 Oral     SpO2 09/18/23 1127 98 %     Weight --      Height --      Head Circumference --      Peak Flow --      Pain Score 09/18/23  1128 4     Pain Loc --      Pain Education --      Exclude from Growth Chart --    No data found.  Updated Vital Signs BP 120/79 (BP Location: Right Arm)   Pulse (!) 111   Temp 98.8 F (37.1 C) (Oral)   Resp 20   SpO2 98%   Visual Acuity Right Eye Distance:   Left Eye Distance:   Bilateral Distance:    Right Eye Near:   Left Eye Near:    Bilateral Near:     Physical Exam Vitals and nursing note reviewed.  Constitutional:      Appearance: Normal appearance.  HENT:     Head: Atraumatic.  Eyes:     Extraocular Movements: Extraocular movements intact.     Conjunctiva/sclera: Conjunctivae normal.  Cardiovascular:     Rate and Rhythm: Normal rate and regular rhythm.  Pulmonary:     Effort: Pulmonary effort is normal.     Breath sounds: Normal breath sounds.  Musculoskeletal:        General: Normal range of  motion.     Cervical back: Normal range of motion and neck supple.  Skin:    General: Skin is warm and dry.     Comments: Small area of erythematous maculopapular rash to right lateral knee, no appreciable bulls eye currently and no fluctuance, induration, drainage, warmth, ttp  Neurological:     Mental Status: He is oriented to person, place, and time.     Comments: RLE neurovascularly intact  Psychiatric:        Mood and Affect: Mood normal.        Thought Content: Thought content normal.        Judgment: Judgment normal.      UC Treatments / Results  Labs (all labs ordered are listed, but only abnormal results are displayed) Labs Reviewed  LYME DISEASE SEROLOGY W/REFLEX  SPOTTED FEVER GROUP ANTIBODIES    EKG   Radiology No results found.  Procedures Procedures (including critical care time)  Medications Ordered in UC Medications - No data to display  Initial Impression / Assessment and Plan / UC Course  I have reviewed the triage vital signs and the nursing notes.  Pertinent labs & imaging results that were available during my care of the  patient were reviewed by me and considered in my medical decision making (see chart for details).     Appears more of an allergic or irritant dermatitis currently but given his concern for tick bourne illness will obtain RMSF and Lyme titers and await results. Triamcinolone  cream as needed to area, return precautions reviewed.  Final Clinical Impressions(s) / UC Diagnoses   Final diagnoses:  Generalized body aches  Malaise  Rash     Discharge Instructions      We have drawn labs for lyme disease and rocky mountain spotted fever today and will be in touch if anything comes back positive. Use the triamcinolone  twice daily as needed for the rash and stay well hydrated, take tylenol and ibuprofen as needed for body aches. Follow up for progressively worsening symptoms    ED Prescriptions     Medication Sig Dispense Auth. Provider   triamcinolone  cream (KENALOG ) 0.1 % Apply 1 Application topically 2 (two) times daily as needed. 80 g Stuart Vernell Norris, [place]      PDMP not reviewed this encounter.   Stuart Vernell Norris, [place] 09/21/23 2250

## 2023-09-22 LAB — SPOTTED FEVER GROUP ANTIBODIES
Spotted Fever Group IgG: 1:128 {titer} — ABNORMAL HIGH
Spotted Fever Group IgM: <1:64 {titer}

## 2023-09-22 LAB — LYME DISEASE SEROLOGY W/REFLEX: Lyme Total Antibody EIA: NEGATIVE

## 2023-09-23 ENCOUNTER — Ambulatory Visit (HOSPITAL_COMMUNITY): Payer: Self-pay

## 2023-09-24 MED ORDER — DOXYCYCLINE HYCLATE 100 MG PO TABS: 100.0000 mg | ORAL_TABLET | Freq: Two times a day (BID) | ORAL | 0 refills | Status: AC

## 2024-01-17 ENCOUNTER — Ambulatory Visit: Admitting: Family Medicine

## 2024-01-17 ENCOUNTER — Encounter: Payer: Self-pay | Admitting: Family Medicine

## 2024-01-17 VITALS — BP 120/77 | HR 80 | Temp 98.1°F | Ht 70.32 in | Wt 151.2 lb

## 2024-01-17 DIAGNOSIS — Z1159 Encounter for screening for other viral diseases: Secondary | ICD-10-CM

## 2024-01-17 DIAGNOSIS — Z13220 Encounter for screening for lipoid disorders: Secondary | ICD-10-CM | POA: Diagnosis not present

## 2024-01-17 DIAGNOSIS — Z0001 Encounter for general adult medical examination with abnormal findings: Secondary | ICD-10-CM

## 2024-01-17 DIAGNOSIS — F322 Major depressive disorder, single episode, severe without psychotic features: Secondary | ICD-10-CM | POA: Diagnosis not present

## 2024-01-17 DIAGNOSIS — Z23 Encounter for immunization: Secondary | ICD-10-CM | POA: Diagnosis not present

## 2024-01-17 DIAGNOSIS — Z113 Encounter for screening for infections with a predominantly sexual mode of transmission: Secondary | ICD-10-CM | POA: Diagnosis not present

## 2024-01-17 DIAGNOSIS — R7301 Impaired fasting glucose: Secondary | ICD-10-CM | POA: Diagnosis not present

## 2024-01-17 DIAGNOSIS — Z0000 Encounter for general adult medical examination without abnormal findings: Secondary | ICD-10-CM | POA: Diagnosis not present

## 2024-01-17 DIAGNOSIS — F411 Generalized anxiety disorder: Secondary | ICD-10-CM

## 2024-01-17 DIAGNOSIS — Z114 Encounter for screening for human immunodeficiency virus [HIV]: Secondary | ICD-10-CM

## 2024-01-17 NOTE — Progress Notes (Signed)
 New Patient Office Visit  Subjective    Patient ID: James Oliver, male    DOB: 01/29/1991  Age: 33 y.o. MRN: [id number]  CC:  Chief Complaint  Patient presents with   Establish Care    Concerns about random fainting and black outs  Would like STD testing today     HPI James Oliver presents to establish care. Oriented to practice routines and expectations. Has not been seeing PCP regularly. PMH includes allergies, anxiety, depression. Is followed by psychiatry. Concerns include spells of presyncope triggered when his wife flushed his ear a few weeks ago, when he was in the ED with his father in law who had a recent MI, he does believe this may be panic attacks.   Discussed the use of AI scribe software for clinical note transcription with the patient, who gave verbal consent to proceed.  History of Present Illness James Oliver is a 33 year old male who presents with episodes of near syncope and anxiety.  He experiences episodes characterized by an 'adrenaline rush' followed by symptoms of near syncope, including a sensation of blacking out, sweating, and feeling overheated. These episodes have increased in frequency over the past year, occurring four times this year. He has not actually fainted but feels the need to lie down to prevent passing out. Episodes sometimes occur in stressful situations, such as being in the hospital with a family member, but also occur without clear triggers. He notes a possible link to low blood sugar, as episodes often happen when he has not eaten much.  The most recent episode occurred two to three weeks ago at home, triggered by seeing fluid being drained from his ear. Another episode occurred while at the hospital with his father-in-law, who had a heart attack. During these episodes, he sometimes experiences chest pain and arm pain.  He is currently taking Zoloft, Seroquel, and hydroxyzine, and occasionally takes Allegra  for allergies. He reports that his skin feels thinner and more prone to cuts. He has two indoor cats and two indoor dogs, which exacerbate his allergies.  He has been experiencing increased anxiety and panic attacks due to recent personal stressors. He describes feeling like he is 'constantly living in a panic attack'. He sees a psychiatrist regularly, at least once a week.  Socially, he has been married for four years and has been the primary financial supporter of his wife, who has been pursuing further education. He reports increased alcohol consumption recently, typically drinking socially on weekends but more frequently during the week lately. He uses e-cigarettes.     01/17/2024    9:39 AM  GAD 7 : Generalized Anxiety Score  Nervous, Anxious, on Edge 3  Control/stop worrying 3  Worry too much - different things 3  Trouble relaxing 3  Restless 3  Easily annoyed or irritable 3  Afraid - awful might happen 3  Total GAD 7 Score 21  Anxiety Difficulty Very difficult         01/17/2024    8:10 AM  Depression screen PHQ 2/9  Decreased Interest 3  Down, Depressed, Hopeless 3  PHQ - 2 Score 6  Altered sleeping 3  Tired, decreased energy 3  Change in appetite 3  Feeling bad or failure about yourself  3  Trouble concentrating 3  Moving slowly or fidgety/restless 3  Suicidal thoughts 1  PHQ-9 Score 25  Difficult doing work/chores Extremely dIfficult      Outpatient Encounter Medications as of  01/17/2024  Medication Sig   hydrOXYzine (ATARAX) 25 MG tablet Take 25 mg by mouth 3 (three) times daily. 1-2 tablets 3 times daily   QUEtiapine (SEROQUEL) 50 MG tablet Take 50 mg by mouth at bedtime.   sertraline (ZOLOFT) 100 MG tablet Take 100 mg by mouth every morning.   fexofenadine (ALLEGRA) 180 MG tablet Take 180 mg by mouth daily as needed for allergies.   triamcinolone  cream (KENALOG ) 0.1 % Apply 1 Application topically 2 (two) times daily as needed. (Patient not taking:  Reported on 01/17/2024)   No facility-administered encounter medications on file as of 01/17/2024.    Past Medical History:  Diagnosis Date   Anxiety    Depression     Past Surgical History:  Procedure Laterality Date   WISDOM TOOTH EXTRACTION  2001    Family History  Problem Relation Age of Onset   Anxiety disorder Mother    Depression Mother     Social History   Socioeconomic History   Marital status: Married    Spouse name: Not on file   Number of children: Not on file   Years of education: Not on file   Highest education level: GED or equivalent  Occupational History   Occupation: nature conservation officer    Comment: plumbing for [employer]  Tobacco Use   Smoking status: Unknown   Smokeless tobacco: Not on file  Vaping Use   Vaping status: Every Day   Substances: Nicotine  Substance and Sexual Activity   Alcohol use: Yes    Comment: every other day due to current mental health issues   Drug use: Not Currently   Sexual activity: Yes  Other Topics Concern   Not on file  Social History Narrative   Not on file   Social Drivers of Health   Financial Resource Strain: Medium Risk (01/13/2024)   Overall Financial Resource Strain (CARDIA)    Difficulty of Paying Living Expenses: Somewhat hard  Food Insecurity: No Food Insecurity (01/13/2024)   Hunger Vital Sign    Worried About Running Out of Food in the Last Year: Never true    Ran Out of Food in the Last Year: Never true  Transportation Needs: No Transportation Needs (01/13/2024)   PRAPARE - Administrator, Civil Service (Medical): No    Lack of Transportation (Non-Medical): No  Physical Activity: Sufficiently Active (01/13/2024)   Exercise Vital Sign    Days of Exercise per Week: 7 days    Minutes of Exercise per Session: 150+ min  Stress: Stress Concern Present (01/13/2024)   [employer] of Occupational Health - Occupational Stress Questionnaire    Feeling of Stress: Very much  Social  Connections: Moderately Isolated (01/13/2024)   Social Connection and Isolation Panel    Frequency of Communication with Friends and Family: Once a week    Frequency of Social Gatherings with Friends and Family: Once a week    Attends Religious Services: Never    Database Administrator or Organizations: Yes    Attends Engineer, Structural: More than 4 times per year    Marital Status: Married  Catering Manager Violence: Not on file    Review of Systems  Constitutional: Negative.   HENT: Negative.    Eyes: Negative.   Respiratory: Negative.    Cardiovascular: Negative.   Gastrointestinal: Negative.   Genitourinary: Negative.   Musculoskeletal: Negative.   Skin: Negative.   Neurological: Negative.   Endo/Heme/Allergies:  Positive for environmental allergies.  Psychiatric/Behavioral:  Negative.    All other systems reviewed and are negative.       Objective    BP 120/77   Pulse 80   Temp 98.1 F (36.7 C)   Ht 5' 10.32 (1.786 m)   Wt 151 lb 3.2 oz (68.6 kg)   SpO2 97%   BMI 21.50 kg/m   Physical Exam Vitals and nursing note reviewed.  Constitutional:      Appearance: Normal appearance. He is normal weight.  HENT:     Head: Normocephalic and atraumatic.     Right Ear: Tympanic membrane, ear canal and external ear normal.     Left Ear: Tympanic membrane, ear canal and external ear normal.     Nose: Nose normal.     Mouth/Throat:     Mouth: Mucous membranes are moist.     Pharynx: Oropharynx is clear.  Eyes:     Extraocular Movements: Extraocular movements intact.     Right eye: Normal extraocular motion and no nystagmus.     Left eye: Normal extraocular motion and no nystagmus.     Conjunctiva/sclera: Conjunctivae normal.     Pupils: Pupils are equal, round, and reactive to light.  Cardiovascular:     Rate and Rhythm: Normal rate and regular rhythm.     Pulses: Normal pulses.     Heart sounds: Normal heart sounds.  Pulmonary:     Effort: Pulmonary  effort is normal.     Breath sounds: Normal breath sounds.  Abdominal:     General: Bowel sounds are normal.     Palpations: Abdomen is soft.  Genitourinary:    Comments: Deferred using shared decision making Musculoskeletal:        General: Normal range of motion.     Cervical back: Normal range of motion and neck supple.  Skin:    General: Skin is warm and dry.     Capillary Refill: Capillary refill takes less than 2 seconds.  Neurological:     General: No focal deficit present.     Mental Status: He is alert. Mental status is at baseline.  Psychiatric:        Mood and Affect: Mood normal.        Speech: Speech normal.        Behavior: Behavior normal.        Thought Content: Thought content normal.        Cognition and Memory: Cognition and memory normal.        Judgment: Judgment normal.         Assessment & Plan:   Problem List Items Addressed This Visit     Physical exam, annual - Primary   Today your medical history was reviewed and routine physical exam with labs was performed. Recommend 150 minutes of moderate intensity exercise weekly and consuming a well-balanced diet. Advised to stop smoking if a smoker, avoid smoking if a non-smoker, limit alcohol consumption to 1 drink per day for women and 2 drinks per day for men, and avoid illicit drug use. Counseled on safe sex practices and offered STI testing today. Counseled on the importance of sunscreen use. Counseled in mental health awareness and when to seek medical care. Vaccine maintenance discussed. Appropriate health maintenance items reviewed. Return to office in 1 year for annual physical exam.  Assessment and Plan Assessment & Plan Adult Wellness Visit Routine wellness visit with no significant medical history except allergies and presyncope. Family history of diabetes and heart issues. Recent increase in alcohol  and vaping. - Performed blood work: CBC, metabolic panel, cholesterol, diabetes screening, thyroid  function tests. - Administered Gardasil vaccine. - Administered flu vaccine. - Checked [place] immunization registry for tetanus status. - Advised moderation of alcohol and vaping.        Relevant Orders   CBC with Differential/Platelet   Comprehensive metabolic panel with GFR   Lipid panel   Hemoglobin A1c   TSH   Hepatitis Panel (REFL)   HIV Antibody (routine testing w rflx)   C. trachomatis/N. gonorrhoeae RNA   RPR   EKG 12-Lead   Generalized anxiety disorder   GAD 21, followed by psychiatry. Continue Seroquel, Zoloft, Hydroxyzine as prescribed. Denies SI/HI. Seek immediate medical care if these thoughts do arise. Resources include 988 and [facility].       Relevant Medications   sertraline (ZOLOFT) 100 MG tablet   hydrOXYzine (ATARAX) 25 MG tablet   Depression, major, single episode, severe (HCC)   PHQ 25, followed by psychiatry. Continue Seroquel, Zoloft, Hydroxyzine as prescribed. Denies SI/HI. Seek immediate medical care if these thoughts do arise. Resources include 988 and [facility].         Relevant Medications   sertraline (ZOLOFT) 100 MG tablet   hydrOXYzine (ATARAX) 25 MG tablet   Other Visit Diagnoses       Routine screening for STI (sexually transmitted infection)       Relevant Orders   Hepatitis Panel (REFL)   HIV Antibody (routine testing w rflx)   C. trachomatis/N. gonorrhoeae RNA   RPR     Screening for HIV (human immunodeficiency virus)       Relevant Orders   Hepatitis Panel (REFL)   HIV Antibody (routine testing w rflx)   C. trachomatis/N. gonorrhoeae RNA   RPR     Need for hepatitis C screening test       Relevant Orders   Hepatitis Panel (REFL)     Screening for lipoid disorders       Relevant Orders   Lipid panel       Return in about 1 year (around 01/16/2025) for annual physical with labs 1 week prior.   Jeoffrey GORMAN Barrio, FNP

## 2024-01-17 NOTE — Assessment & Plan Note (Signed)
 PHQ 25, followed by psychiatry. Continue Seroquel, Zoloft, Hydroxyzine as prescribed. Denies SI/HI. Seek immediate medical care if these thoughts do arise. Resources include 988 and [facility].

## 2024-01-17 NOTE — Patient Instructions (Signed)
 It was great to meet you today and I'm excited to have you join the [facility] practice. I hope you had a positive experience today! If you feel so inclined, please feel free to recommend our practice to friends and family. Jeoffrey Barrio, FNP-C

## 2024-01-17 NOTE — Assessment & Plan Note (Signed)
 Today your medical history was reviewed and routine physical exam with labs was performed. Recommend 150 minutes of moderate intensity exercise weekly and consuming a well-balanced diet. Advised to stop smoking if a smoker, avoid smoking if a non-smoker, limit alcohol consumption to 1 drink per day for women and 2 drinks per day for men, and avoid illicit drug use. Counseled on safe sex practices and offered STI testing today. Counseled on the importance of sunscreen use. Counseled in mental health awareness and when to seek medical care. Vaccine maintenance discussed. Appropriate health maintenance items reviewed. Return to office in 1 year for annual physical exam.  Assessment and Plan Assessment & Plan Adult Wellness Visit Routine wellness visit with no significant medical history except allergies and presyncope. Family history of diabetes and heart issues. Recent increase in alcohol and vaping. - Performed blood work: CBC, metabolic panel, cholesterol, diabetes screening, thyroid function tests. - Administered Gardasil vaccine. - Administered flu vaccine. - Checked [place] immunization registry for tetanus status. - Advised moderation of alcohol and vaping.

## 2024-01-17 NOTE — Assessment & Plan Note (Addendum)
 GAD 21, followed by psychiatry. Continue Seroquel, Zoloft, Hydroxyzine as prescribed. Denies SI/HI. Seek immediate medical care if these thoughts do arise. Resources include 988 and [facility].

## 2024-01-18 LAB — C. TRACHOMATIS/N. GONORRHOEAE RNA
C. trachomatis RNA, TMA: NOT DETECTED
N. gonorrhoeae RNA, TMA: NOT DETECTED

## 2024-01-19 LAB — LIPID PANEL
Cholesterol: 179 mg/dL (ref <200)
HDL: 84 mg/dL (ref >=40)
LDL Cholesterol (Calc): 83 mg/dL
Non-HDL Cholesterol (Calc): 95 mg/dL (ref <130)
Total CHOL/HDL Ratio: 2.1 (calc) (ref <5.0)
Triglycerides: 50 mg/dL (ref <150)

## 2024-01-19 LAB — COMPREHENSIVE METABOLIC PANEL WITH GFR
AG Ratio: 2 (calc) (ref 1.0–2.5)
ALT: 16 U/L (ref 9–46)
AST: 18 U/L (ref 10–40)
Albumin: 4.4 g/dL (ref 3.6–5.1)
Alkaline phosphatase (APISO): 50 U/L (ref 36–130)
BUN: 18 mg/dL (ref 7–25)
CO2: 30 mmol/L (ref 20–32)
Calcium: 9.3 mg/dL (ref 8.6–10.3)
Chloride: 102 mmol/L (ref 98–110)
Creat: 0.99 mg/dL (ref 0.60–1.26)
Globulin: 2.2 g/dL (ref 1.9–3.7)
Glucose, Bld: 82 mg/dL (ref 65–99)
Potassium: 3.9 mmol/L (ref 3.5–5.3)
Sodium: 140 mmol/L (ref 135–146)
Total Bilirubin: 0.4 mg/dL (ref 0.2–1.2)
Total Protein: 6.6 g/dL (ref 6.1–8.1)
eGFR: 104 mL/min/1.73m2 (ref >=60)

## 2024-01-19 LAB — CBC WITH DIFFERENTIAL/PLATELET
Absolute Lymphocytes: 1303 {cells}/uL (ref 850–3900)
Absolute Monocytes: 559 {cells}/uL (ref 200–950)
Basophils Absolute: 49 {cells}/uL (ref 0–200)
Basophils Relative: 0.5 %
Eosinophils Absolute: 147 {cells}/uL (ref 15–500)
Eosinophils Relative: 1.5 %
HCT: 43.4 % (ref 38.5–50.0)
Hemoglobin: 14.3 g/dL (ref 13.2–17.1)
MCH: 30.6 pg (ref 27.0–33.0)
MCHC: 32.9 g/dL (ref 32.0–36.0)
MCV: 92.9 fL (ref 80.0–100.0)
MPV: 9.3 fL (ref 7.5–12.5)
Monocytes Relative: 5.7 %
Neutro Abs: 7742 {cells}/uL (ref 1500–7800)
Neutrophils Relative %: 79 %
Platelets: 254 Thousand/uL (ref 140–400)
RBC: 4.67 Million/uL (ref 4.20–5.80)
RDW: 12.1 % (ref 11.0–15.0)
Total Lymphocyte: 13.3 %
WBC: 9.8 Thousand/uL (ref 3.8–10.8)

## 2024-01-19 LAB — HIV ANTIBODY (ROUTINE TESTING W REFLEX)
HIV 1&2 Ab, 4th Generation: NONREACTIVE
HIV FINAL INTERPRETATION: NEGATIVE

## 2024-01-19 LAB — HEPATITIS PANEL(REFL)
HEPATITIS C ANTIBODY REFILL$(REFL): NONREACTIVE
Hep B Core Total Ab: NONREACTIVE
Hep B S Ab: REACTIVE — AB
Hepatitis A AB,Total: NONREACTIVE
Hepatitis B Surface Ag: NONREACTIVE

## 2024-01-19 LAB — TSH: TSH: 1.79 m[IU]/L (ref 0.40–4.50)

## 2024-01-19 LAB — HEMOGLOBIN A1C
Hgb A1c MFr Bld: 5.1 % (ref <5.7)
Mean Plasma Glucose: 100 mg/dL
eAG (mmol/L): 5.5 mmol/L

## 2024-01-19 LAB — RPR: RPR Ser Ql: NONREACTIVE

## 2024-01-19 LAB — REFLEX TIQ

## 2024-01-21 ENCOUNTER — Ambulatory Visit: Payer: Self-pay | Admitting: Family Medicine

## 2025-01-15 ENCOUNTER — Other Ambulatory Visit

## 2025-01-18 ENCOUNTER — Encounter: Admitting: Family Medicine
